# Patient Record
Sex: Male | Born: 2005 | Race: Black or African American | Hispanic: No | Marital: Single | Smoking: Never smoker
Health system: Southern US, Community
[De-identification: ages and names within clinical notes are randomized; demographics above are authoritative.]

## PROBLEM LIST (undated history)

## (undated) DIAGNOSIS — T7840XA Allergy, unspecified, initial encounter: Secondary | ICD-10-CM

## (undated) DIAGNOSIS — F909 Attention-deficit hyperactivity disorder, unspecified type: Secondary | ICD-10-CM

## (undated) HISTORY — DX: Allergy, unspecified, initial encounter: T78.40XA

## (undated) HISTORY — DX: Attention-deficit hyperactivity disorder, unspecified type: F90.9

---
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
## 2024-07-19 ENCOUNTER — Emergency Department (HOSPITAL_COMMUNITY)
Admission: EM | Admit: 2024-07-19 | Discharge: 2024-07-19 | Disposition: A | Discharge status: Home / Self Care | Attending: Emergency Medicine | Admitting: Emergency Medicine

## 2024-07-19 ENCOUNTER — Encounter (HOSPITAL_COMMUNITY): Payer: Self-pay

## 2024-07-19 ENCOUNTER — Other Ambulatory Visit: Payer: Self-pay

## 2024-07-19 DIAGNOSIS — R059 Cough, unspecified: Secondary | ICD-10-CM | POA: Diagnosis present

## 2024-07-19 DIAGNOSIS — J069 Acute upper respiratory infection, unspecified: Secondary | ICD-10-CM | POA: Insufficient documentation

## 2024-07-19 LAB — RESP PANEL BY RT-PCR (RSV, FLU A&B, COVID)  RVPGX2
Influenza A by PCR: NEGATIVE
Influenza B by PCR: NEGATIVE
Resp Syncytial Virus by PCR: NEGATIVE
SARS Coronavirus 2 by RT PCR: NEGATIVE

## 2024-07-19 MED ORDER — FLUTICASONE PROPIONATE 50 MCG/ACT NA SUSP: 2.0000 | Freq: Every day | NASAL | 0 refills | Status: AC

## 2024-07-19 MED ORDER — BENZONATATE 100 MG PO CAPS: 100.0000 mg | ORAL_CAPSULE | Freq: Three times a day (TID) | ORAL | 0 refills | Status: AC

## 2024-07-19 NOTE — ED Triage Notes (Signed)
 Pt arrived via POV c/o nasal congestion that began yesterday. Pt presents in NAD and requests a Dr's Note at time of discharge.

## 2024-07-19 NOTE — Discharge Instructions (Signed)

## 2024-07-19 NOTE — ED Provider Notes (Signed)
 [place] EMERGENCY DEPARTMENT AT [facility] Provider Note   CSN: [id number] Arrival date & time: 07/19/24  1107     Patient presents with: Nasal Congestion   Oscar Wheeler is a 19 y.o. male.   Patient is an 18 year old male who presents to the emergency department with a chief complaint of nasal congestion and cough which has been ongoing since yesterday.  He denies any known sick contacts.  He has had no associated chest pain or shortness of breath.  He denies any associated sore throat.  He has had no abdominal pain, nausea, vomiting, diarrhea.        Prior to Admission medications   Medication Sig Start Date End Date Taking? Authorizing Provider  benzonatate (TESSALON) 100 MG capsule Take 1 capsule (100 mg total) by mouth every 8 (eight) hours. 07/19/24  Yes Daralene Bruckner D, PA-C  fluticasone  (FLONASE ) 50 MCG/ACT nasal spray Place 2 sprays into both nostrils daily. 07/19/24  Yes Daralene Bruckner BIRCH, PA-C  hydrocortisone  2.5 % cream Apply to rash twice a day for up to one week as needed for itching 01/20/19   Theotis Allena HERO, MD  lisdexamfetamine (VYVANSE ) 40 MG capsule Dispense brand or generic for insurance. Take one capsule after breakfast. 10/25/19   Theotis Allena HERO, MD    Allergies: Patient has no known allergies.    Review of Systems  HENT:  Positive for rhinorrhea and sinus pressure.   Respiratory:  Positive for cough.   All other systems reviewed and are negative.   Updated Vital Signs BP 125/82 (BP Location: Right Arm)   Pulse 82   Temp 99.2 F (37.3 C) (Oral)   Resp 16   Ht 5' 10 (1.778 m)   Wt 74.8 kg   SpO2 98%   BMI 23.68 kg/m   Physical Exam Vitals and nursing note reviewed.  Constitutional:      Appearance: Normal appearance.  HENT:     Head: Normocephalic and atraumatic.     Nose: Nose normal. No congestion or rhinorrhea.     Mouth/Throat:     Mouth: Mucous membranes are moist.     Pharynx: No oropharyngeal exudate  or posterior oropharyngeal erythema.  Eyes:     Extraocular Movements: Extraocular movements intact.     Conjunctiva/sclera: Conjunctivae normal.     Pupils: Pupils are equal, round, and reactive to light.  Cardiovascular:     Rate and Rhythm: Normal rate and regular rhythm.     Pulses: Normal pulses.     Heart sounds: Normal heart sounds. No murmur heard.    No gallop.  Pulmonary:     Effort: Pulmonary effort is normal. No respiratory distress.     Breath sounds: Normal breath sounds. No stridor. No wheezing, rhonchi or rales.  Abdominal:     General: Abdomen is flat. Bowel sounds are normal.     Palpations: Abdomen is soft.  Musculoskeletal:        General: Normal range of motion.     Cervical back: Normal range of motion and neck supple.  Skin:    General: Skin is warm and dry.  Neurological:     General: No focal deficit present.     Mental Status: He is alert and oriented to person, place, and time. Mental status is at baseline.  Psychiatric:        Mood and Affect: Mood normal.        Behavior: Behavior normal.  Thought Content: Thought content normal.        Judgment: Judgment normal.     (all labs ordered are listed, but only abnormal results are displayed) Labs Reviewed  RESP PANEL BY RT-PCR (RSV, FLU A&B, COVID)  [id number]    EKG: None  Radiology: No results found.   Procedures   Medications Ordered in the ED - No data to display                                  Medical Decision Making Patient is doing well at this time and is stable for discharge home.  Discussed with patient that he is negative for COVID-19, influenza, RSV.  Still suspect an acute viral syndrome at this point.  Will continue symptomatic treatment outpatient basis.  Work note was provided.  Patient has stable vital signs.  Do not suspect that any further workup is warranted on emergent basis.  Close follow-up with the PCP was discussed as well as strict turn precautions for any new or  worsening symptoms.  Patient voiced understanding and had no additional questions.  Risk Prescription drug management.        Final diagnoses:  Viral upper respiratory tract infection    ED Discharge Orders          Ordered    fluticasone  (FLONASE ) 50 MCG/ACT nasal spray  Daily        07/19/24 1227    benzonatate (TESSALON) 100 MG capsule  Every 8 hours        07/19/24 1227               Laurelai Lepp D, PA-C 07/19/24 1228    Suzette Pac, MD 07/20/24 1730
